# Patient Record
Sex: Male | Born: 1985 | Race: Black or African American | Hispanic: No | Marital: Single | Smoking: Never smoker
Health system: Southern US, Community
[De-identification: ages and names within clinical notes are randomized; demographics above are authoritative.]

## PROBLEM LIST (undated history)

## (undated) DIAGNOSIS — M419 Scoliosis, unspecified: Secondary | ICD-10-CM

## (undated) DIAGNOSIS — I10 Essential (primary) hypertension: Secondary | ICD-10-CM

---

## 2018-01-19 ENCOUNTER — Emergency Department: Payer: Self-pay

## 2018-01-19 ENCOUNTER — Encounter: Payer: Self-pay | Admitting: Emergency Medicine

## 2018-01-19 ENCOUNTER — Emergency Department
Admission: EM | Admit: 2018-01-19 | Discharge: 2018-01-19 | Disposition: A | Discharge status: Home / Self Care | Payer: Self-pay | Attending: Emergency Medicine | Admitting: Emergency Medicine

## 2018-01-19 DIAGNOSIS — X509XXA Other and unspecified overexertion or strenuous movements or postures, initial encounter: Secondary | ICD-10-CM | POA: Insufficient documentation

## 2018-01-19 DIAGNOSIS — Y999 Unspecified external cause status: Secondary | ICD-10-CM | POA: Insufficient documentation

## 2018-01-19 DIAGNOSIS — S39012A Strain of muscle, fascia and tendon of lower back, initial encounter: Secondary | ICD-10-CM | POA: Insufficient documentation

## 2018-01-19 DIAGNOSIS — R079 Chest pain, unspecified: Secondary | ICD-10-CM | POA: Insufficient documentation

## 2018-01-19 DIAGNOSIS — Y929 Unspecified place or not applicable: Secondary | ICD-10-CM | POA: Insufficient documentation

## 2018-01-19 DIAGNOSIS — Y939 Activity, unspecified: Secondary | ICD-10-CM | POA: Insufficient documentation

## 2018-01-19 DIAGNOSIS — I10 Essential (primary) hypertension: Secondary | ICD-10-CM | POA: Insufficient documentation

## 2018-01-19 HISTORY — DX: Essential (primary) hypertension: I10

## 2018-01-19 LAB — URINE DRUG SCREEN, QUALITATIVE (ARMC ONLY)
AMPHETAMINES, UR SCREEN: NOT DETECTED
BARBITURATES, UR SCREEN: NOT DETECTED
Cannabinoid 50 Ng, Ur ~~LOC~~: NOT DETECTED
Cocaine Metabolite,Ur ~~LOC~~: NOT DETECTED
MDMA (Ecstasy)Ur Screen: NOT DETECTED
METHADONE SCREEN, URINE: NOT DETECTED
Opiate, Ur Screen: NOT DETECTED
PHENCYCLIDINE (PCP) UR S: NOT DETECTED
Tricyclic, Ur Screen: NOT DETECTED

## 2018-01-19 LAB — CBC
HCT: 43.7 % (ref 40.0–52.0)
Hemoglobin: 15.2 g/dL (ref 13.0–18.0)
MCH: 28.6 pg (ref 26.0–34.0)
MCHC: 34.7 g/dL (ref 32.0–36.0)
MCV: 82.2 fL (ref 80.0–100.0)
PLATELETS: 232 10*3/uL (ref 150–440)
RBC: 5.31 MIL/uL (ref 4.40–5.90)
RDW: 15 % — AB (ref 11.5–14.5)
WBC: 8 10*3/uL (ref 3.8–10.6)

## 2018-01-19 LAB — BASIC METABOLIC PANEL
Anion gap: 8 (ref 5–15)
BUN: 17 mg/dL (ref 6–20)
CALCIUM: 9.1 mg/dL (ref 8.9–10.3)
CO2: 28 mmol/L (ref 22–32)
CREATININE: 1.52 mg/dL — AB (ref 0.61–1.24)
Chloride: 104 mmol/L (ref 98–111)
GFR calc non Af Amer: 60 mL/min — ABNORMAL LOW (ref >60)
Glucose, Bld: 97 mg/dL (ref 70–99)
Potassium: 3.3 mmol/L — ABNORMAL LOW (ref 3.5–5.1)
SODIUM: 140 mmol/L (ref 135–145)

## 2018-01-19 LAB — TROPONIN I: Troponin I: <0.03 ng/mL (ref <0.03)

## 2018-01-19 LAB — ETHANOL

## 2018-01-19 MED ORDER — HYDROCHLOROTHIAZIDE 25 MG PO TABS
25.0000 mg | ORAL_TABLET | Freq: Every day | ORAL | Status: DC
  Administered 2018-01-19: 25 mg via ORAL

## 2018-01-19 MED ORDER — NAPROXEN 500 MG PO TABS: 500.0000 mg | ORAL_TABLET | Freq: Two times a day (BID) | ORAL | 0 refills | Status: AC

## 2018-01-19 MED ORDER — LISINOPRIL 40 MG PO TABS: 40.0000 mg | ORAL_TABLET | Freq: Every day | ORAL | 0 refills | Status: DC

## 2018-01-19 MED ORDER — POTASSIUM CHLORIDE CRYS ER 20 MEQ PO TBCR
40.0000 meq | EXTENDED_RELEASE_TABLET | Freq: Once | ORAL | Status: AC
Start: 2018-01-19 — End: 2018-01-19
  Administered 2018-01-19: 40 meq via ORAL
  Filled 2018-01-19: qty 2

## 2018-01-19 MED ORDER — HYDROCHLOROTHIAZIDE 25 MG PO TABS
ORAL_TABLET | ORAL | Status: AC
  Administered 2018-01-19: 25 mg via ORAL
  Filled 2018-01-19: qty 1

## 2018-01-19 MED ORDER — LISINOPRIL 10 MG PO TABS
40.0000 mg | ORAL_TABLET | Freq: Once | ORAL | Status: AC
  Administered 2018-01-19: 40 mg via ORAL
  Filled 2018-01-19: qty 4

## 2018-01-19 MED ORDER — CYCLOBENZAPRINE HCL 5 MG PO TABS: ORAL_TABLET | ORAL | 0 refills | Status: AC

## 2018-01-19 MED ORDER — CLONIDINE HCL 0.1 MG PO TABS: 0.1000 mg | ORAL_TABLET | Freq: Once | ORAL | Status: DC

## 2018-01-19 MED ORDER — AMLODIPINE BESYLATE 10 MG PO TABS: 10.0000 mg | ORAL_TABLET | Freq: Every day | ORAL | 0 refills | Status: DC

## 2018-01-19 MED ORDER — METOPROLOL TARTRATE 25 MG PO TABS: 25.0000 mg | ORAL_TABLET | Freq: Two times a day (BID) | ORAL | 0 refills | Status: DC

## 2018-01-19 MED ORDER — KETOROLAC TROMETHAMINE 30 MG/ML IJ SOLN
10.0000 mg | Freq: Once | INTRAMUSCULAR | Status: AC
  Administered 2018-01-19: 9.9 mg via INTRAVENOUS
  Filled 2018-01-19: qty 1

## 2018-01-19 MED ORDER — AMLODIPINE BESYLATE 5 MG PO TABS
10.0000 mg | ORAL_TABLET | Freq: Once | ORAL | Status: AC
Start: 2018-01-19 — End: 2018-01-19
  Administered 2018-01-19: 10 mg via ORAL
  Filled 2018-01-19: qty 2

## 2018-01-19 MED ORDER — METOPROLOL TARTRATE 25 MG PO TABS
25.0000 mg | ORAL_TABLET | Freq: Once | ORAL | Status: AC
  Administered 2018-01-19: 25 mg via ORAL
  Filled 2018-01-19: qty 1

## 2018-01-19 MED ORDER — HYDROCHLOROTHIAZIDE 25 MG PO TABS: 25.0000 mg | ORAL_TABLET | Freq: Every day | ORAL | 0 refills | Status: DC

## 2018-01-19 NOTE — ED Notes (Signed)
Dr. Sung at the bedside for pt evaluation 

## 2018-01-19 NOTE — ED Notes (Signed)
NAD noted at time of D/C. Pt denies questions or concerns. Pt ambulatory to the lobby at this time. MD aware that patient continues to be hypertensive at discharge.

## 2018-01-19 NOTE — Discharge Instructions (Addendum)
1.  You may take medicines as needed for pain and muscle spasms (Naprosyn/Flexeril). 2.  Apply moist heat to affected area several times daily. 3.  Please restart your blood pressure medicines: Lisinopril 40 mg daily Norvasc 10 mg daily HCTZ 25 mg daily Metoprolol tartrate 25 mg twice daily 4.  Return to the ER for worsening symptoms, persistent vomiting, difficulty breathing or other concerns.

## 2018-01-19 NOTE — ED Triage Notes (Signed)
Pt c/o intermittent central chest pain since waking Monday AM. Pain consist throughout night where pt was unable to sleep.

## 2018-01-19 NOTE — ED Provider Notes (Signed)
[facility] Emergency Department Provider Note   ____________________________________________   First MD Initiated Contact with Patient 01/19/18 [phone]     (approximate)  I have reviewed the triage vital signs and the nursing notes.   HISTORY  Chief Complaint Chest Pain    HPI Stephen Montgomery is a 32 y.o. male who presents to the ED from home with a chief complaint of chest pain and left back strain.  Patient reports bending over to pick something up yesterday and noted pain to his left lower back since.  Pain exacerbated by movement.  Also notes central chest pain/soreness since waking up from a nap yesterday morning which prevented him from going to sleep tonight.  Denies associated diaphoresis, shortness of breath, nausea/vomiting, palpitations or dizziness.  Admits that he has not taken any of his for blood pressure medicines for the past 2-1/2 months.  Denies recent fever, chills, abdominal pain, nausea, vomiting or diarrhea.  Denies recent travel or trauma.   Past Medical History:  Diagnosis Date  . Hypertension     There are no active problems to display for this patient.   History reviewed. No pertinent surgical history.  Prior to Admission medications   Not on File    Allergies Patient has no known allergies.  History reviewed. No pertinent family history.  Social History Social History   Tobacco Use  . Smoking status: Never Smoker  . Smokeless tobacco: Never Used  Substance Use Topics  . Alcohol use: Yes  . Drug use: Not Currently    Review of Systems  Constitutional: No fever/chills Eyes: No visual changes. ENT: No sore throat. Cardiovascular: Positive for chest pain. Respiratory: Denies shortness of breath. Gastrointestinal: No abdominal pain.  No nausea, no vomiting.  No diarrhea.  No constipation. Genitourinary: Negative for dysuria. Musculoskeletal: Positive for back pain. Skin: Negative for rash. Neurological:  Negative for headaches, focal weakness or numbness.   ____________________________________________   PHYSICAL EXAM:  VITAL SIGNS: ED Triage Vitals [01/19/18 0201]  Enc Vitals Group     BP (!) 195/118     Pulse Rate 89     Resp 17     Temp 98.7 F (37.1 C)     Temp Source Oral     SpO2 99 %     Weight      Height      Head Circumference      Peak Flow      Pain Score      Pain Loc      Pain Edu?      Excl. in GC?     Constitutional: Asleep, awakened for exam.  Alert and oriented. Well appearing and in no acute distress. Eyes: Conjunctivae are normal. PERRL. EOMI. Head: Atraumatic. Nose: No congestion/rhinnorhea. Mouth/Throat: Mucous membranes are moist.  Oropharynx non-erythematous. Neck: No stridor.   Cardiovascular: Normal rate, regular rhythm. Grossly normal heart sounds.  Good peripheral circulation. Respiratory: Normal respiratory effort.  No retractions. Lungs CTAB. Gastrointestinal: Soft and nontender to light or deep palpation. No distention. No abdominal bruits. No CVA tenderness. Musculoskeletal: No spinal tenderness to palpation.  Mild left lumbar paraspinal muscle spasms.  No lower extremity tenderness nor edema.  No joint effusions. Neurologic:  Normal speech and language. No gross focal neurologic deficits are appreciated. No gait instability. Skin:  Skin is warm, dry and intact. No rash noted. Psychiatric: Mood and affect are normal. Speech and behavior are normal.  ____________________________________________   LABS (all labs ordered are listed,  but only abnormal results are displayed)  Labs Reviewed  BASIC METABOLIC PANEL - Abnormal; Notable for the following components:      Result Value   Potassium 3.3 (*)    Creatinine, Ser 1.52 (*)    GFR calc non Af Amer 60 (*)    All other components within normal limits  CBC - Abnormal; Notable for the following components:   RDW 15.0 (*)    All other components within normal limits  URINE DRUG SCREEN,  QUALITATIVE ([facility] ONLY) - Abnormal; Notable for the following components:   Benzodiazepine, Ur Scrn TEST NOT PERFORMED, REAGENT NOT AVAILABLE (*)    All other components within normal limits  TROPONIN I  TROPONIN I  ETHANOL   ____________________________________________  EKG  ED ECG REPORT I, , J, the attending physician, personally viewed and interpreted this ECG.   Date: 01/19/2018  EKG Time: 0158  Rate: 85  Rhythm: normal EKG, normal sinus rhythm  Axis: Normal  Intervals:none  ST&T Change: Nonspecific  ____________________________________________  RADIOLOGY  ED MD interpretation: No acute cardiopulmonary process  Official radiology report(s): Dg Chest 2 View  Result Date: 01/19/2018 CLINICAL DATA:  Acute onset of central chest pain and left back pain. EXAM: CHEST - 2 VIEW COMPARISON:  None. FINDINGS: The lungs are well-aerated and clear. There is no evidence of focal opacification, pleural effusion or pneumothorax. The heart is normal in size; the mediastinal contour is within normal limits. No acute osseous abnormalities are seen. IMPRESSION: No acute cardiopulmonary process seen. Electronically Signed   By: Roanna Raider M.D.   On: 01/19/2018 02:20    ____________________________________________   PROCEDURES  Procedure(s) performed: None  Procedures  Critical Care performed: No  ____________________________________________   INITIAL IMPRESSION / ASSESSMENT AND PLAN / ED COURSE  As part of my medical decision making, I reviewed the following data within the electronic MEDICAL RECORD NUMBER Nursing notes reviewed and incorporated, Labs reviewed, EKG interpreted, Old chart reviewed, Radiograph reviewed and Notes from prior ED visits   32 year old male with hypertension who presents with chest pain and lumbar strain. Differential diagnosis includes, but is not limited to, ACS, aortic dissection, pulmonary embolism, cardiac tamponade, pneumothorax, pneumonia,  pericarditis, myocarditis, GI-related causes including esophagitis/gastritis, and musculoskeletal chest wall pain.    Laboratory results notable for mild hypokalemia, renal insufficiency, initial troponin is negative.  Will administer 10 mg IV Toradol for back pain, replete potassium.  I personally reviewed patient's chart and am able to see the for antihypertensive he is supposed be taking.  We will order these to be taken this morning.  Will reassess after repeat troponin.  Clinical Course as of Jan 20 640  Tue Jan 19, 2018  1610 Repeat troponin unremarkable.  Patient sleeping in no acute distress.  Updated him of test results.  Will discharge home with prescriptions for all 4 of his anti-hypertensives; NSAIDs and muscle relaxer for lumbar strain and encouraged him to follow-up with his PCP this week.  Strict return precautions given.  Patient verbalizes understanding and agrees with plan of care.   [JS]    Clinical Course User Index [JS] Irean Hong, MD     ____________________________________________   FINAL CLINICAL IMPRESSION(S) / ED DIAGNOSES  Final diagnoses:  Chest pain, unspecified type  Essential hypertension  Lumbosacral strain, initial encounter     ED Discharge Orders    None       Note:  This document was prepared using Dragon voice recognition software and may include  unintentional dictation errors.    Irean HongSung,  J, MD 01/19/18 [phone]

## 2018-02-02 ENCOUNTER — Emergency Department
Admission: EM | Admit: 2018-02-02 | Discharge: 2018-02-02 | Discharge status: Against Medical Advice | Payer: Medicaid Other | Attending: Emergency Medicine | Admitting: Emergency Medicine

## 2018-02-02 ENCOUNTER — Encounter: Payer: Self-pay | Admitting: Emergency Medicine

## 2018-02-02 DIAGNOSIS — R42 Dizziness and giddiness: Secondary | ICD-10-CM | POA: Insufficient documentation

## 2018-02-02 DIAGNOSIS — Z5321 Procedure and treatment not carried out due to patient leaving prior to being seen by health care provider: Secondary | ICD-10-CM | POA: Insufficient documentation

## 2018-02-02 NOTE — ED Notes (Signed)
Malinda MD notified that pt left

## 2018-02-02 NOTE — ED Notes (Signed)
First Nurse Note: Patient complaining of dizziness starting last PM, states he has been dizzy like this before but it is lasting longer this time.  States it feels like the room is spinning.

## 2018-02-02 NOTE — ED Triage Notes (Signed)
Pt reports wasn't able to sleep for the 3 days and then yesterday he started feeling dizzy. Pt with increased stress recently. Pt denies CP, SOB.

## 2018-02-02 NOTE — ED Notes (Signed)
Pt refusing blood work and to give urine at this time. RN educated pt on importance of lab results so that MD can  evaluate pt. Pt continues to refuse. Pt stated he was going to leave and proceeded to walk out of tx area. Pt ambulated with steady gait. ABCs intact. NAD.

## 2018-02-02 NOTE — ED Notes (Signed)
Pt refusing to have lab work done. Pt states that he is not trying to be difficult but everytime they do blood work it doesn't tell anything.

## 2018-02-02 NOTE — ED Notes (Signed)
Pt ambulated to tx area with steady gait. ABCs intact. NAD.

## 2018-02-03 ENCOUNTER — Telehealth: Payer: Self-pay | Admitting: Emergency Medicine

## 2018-02-03 NOTE — Telephone Encounter (Signed)
Called patient due to lwot to inquire about condition and follow up plans. Says he left because he did not want his blood drawn because he always gets a bruise for several days after blood draws.  I told that labwork is important in diagnosing, but that he should really just try to get a doctor exam.   He does not have pcp and is new to area.  He says he will look up local doctors to try to get one.

## 2018-03-17 ENCOUNTER — Emergency Department: Payer: Self-pay

## 2018-03-17 ENCOUNTER — Other Ambulatory Visit: Payer: Self-pay

## 2018-03-17 ENCOUNTER — Emergency Department
Admission: EM | Admit: 2018-03-17 | Discharge: 2018-03-17 | Disposition: A | Discharge status: Home / Self Care | Payer: Self-pay | Attending: Emergency Medicine | Admitting: Emergency Medicine

## 2018-03-17 DIAGNOSIS — G5622 Lesion of ulnar nerve, left upper limb: Secondary | ICD-10-CM | POA: Insufficient documentation

## 2018-03-17 DIAGNOSIS — Z9119 Patient's noncompliance with other medical treatment and regimen: Secondary | ICD-10-CM | POA: Insufficient documentation

## 2018-03-17 DIAGNOSIS — I10 Essential (primary) hypertension: Secondary | ICD-10-CM | POA: Insufficient documentation

## 2018-03-17 DIAGNOSIS — Z91199 Patient's noncompliance with other medical treatment and regimen due to unspecified reason: Secondary | ICD-10-CM

## 2018-03-17 DIAGNOSIS — Z79899 Other long term (current) drug therapy: Secondary | ICD-10-CM | POA: Insufficient documentation

## 2018-03-17 DIAGNOSIS — R0789 Other chest pain: Secondary | ICD-10-CM | POA: Insufficient documentation

## 2018-03-17 DIAGNOSIS — N289 Disorder of kidney and ureter, unspecified: Secondary | ICD-10-CM | POA: Insufficient documentation

## 2018-03-17 LAB — CBC WITH DIFFERENTIAL/PLATELET
Basophils Absolute: 0 10*3/uL (ref 0–0.1)
Basophils Relative: 0 %
Eosinophils Absolute: 0.3 10*3/uL (ref 0–0.7)
Eosinophils Relative: 3 %
HEMATOCRIT: 47 % (ref 40.0–52.0)
HEMOGLOBIN: 15.9 g/dL (ref 13.0–18.0)
LYMPHS ABS: 1.8 10*3/uL (ref 1.0–3.6)
LYMPHS PCT: 20 %
MCH: 27.9 pg (ref 26.0–34.0)
MCHC: 33.9 g/dL (ref 32.0–36.0)
MCV: 82.3 fL (ref 80.0–100.0)
MONOS PCT: 8 %
Monocytes Absolute: 0.8 10*3/uL (ref 0.2–1.0)
NEUTROS ABS: 6.2 10*3/uL (ref 1.4–6.5)
NEUTROS PCT: 69 %
Platelets: 275 10*3/uL (ref 150–440)
RBC: 5.71 MIL/uL (ref 4.40–5.90)
RDW: 14.8 % — ABNORMAL HIGH (ref 11.5–14.5)
WBC: 9 10*3/uL (ref 3.8–10.6)

## 2018-03-17 LAB — BASIC METABOLIC PANEL
ANION GAP: 7 (ref 5–15)
BUN: 15 mg/dL (ref 6–20)
CHLORIDE: 105 mmol/L (ref 98–111)
CO2: 27 mmol/L (ref 22–32)
Calcium: 9.1 mg/dL (ref 8.9–10.3)
Creatinine, Ser: 1.45 mg/dL — ABNORMAL HIGH (ref 0.61–1.24)
GFR calc Af Amer: >60 mL/min (ref >60)
GFR calc non Af Amer: >60 mL/min (ref >60)
GLUCOSE: 100 mg/dL — AB (ref 70–99)
POTASSIUM: 4.1 mmol/L (ref 3.5–5.1)
Sodium: 139 mmol/L (ref 135–145)

## 2018-03-17 LAB — TROPONIN I: Troponin I: <0.03 ng/mL (ref <0.03)

## 2018-03-17 MED ORDER — AMLODIPINE BESYLATE 10 MG PO TABS: 10.0000 mg | ORAL_TABLET | Freq: Every day | ORAL | 0 refills | Status: DC

## 2018-03-17 MED ORDER — LISINOPRIL 40 MG PO TABS: 40.0000 mg | ORAL_TABLET | Freq: Every day | ORAL | 0 refills | Status: AC

## 2018-03-17 MED ORDER — METOPROLOL TARTRATE 25 MG PO TABS: 25.0000 mg | ORAL_TABLET | Freq: Two times a day (BID) | ORAL | 0 refills | Status: AC

## 2018-03-17 MED ORDER — HYDROCHLOROTHIAZIDE 25 MG PO TABS: 25.0000 mg | ORAL_TABLET | Freq: Every day | ORAL | 0 refills | Status: AC

## 2018-03-17 MED ORDER — METOPROLOL TARTRATE 50 MG PO TABS
25.0000 mg | ORAL_TABLET | Freq: Once | ORAL | Status: AC
  Administered 2018-03-17: 25 mg via ORAL
  Filled 2018-03-17: qty 1
  Filled 2018-03-17: qty 0.5

## 2018-03-17 MED ORDER — AMLODIPINE BESYLATE 5 MG PO TABS
10.0000 mg | ORAL_TABLET | Freq: Once | ORAL | Status: AC
  Administered 2018-03-17: 10 mg via ORAL
  Filled 2018-03-17: qty 2

## 2018-03-17 NOTE — Clinical Social Work Note (Signed)
CSW received consult that patient needs assistance with medications because he does not have any insurance.  CSW spoke to patient and told him that the best option would be to go to the medication management clinic.  CSW offered to provide contact information but patient stated he already had it.  CSW to sign off, please reconsult if social work needs arise.  Ervin Knack. Marti Acebo, MSW, Theresia Majors [phone]  03/17/2018 4:54 PM

## 2018-03-17 NOTE — ED Triage Notes (Signed)
Pt has left hand numbness for four days.  Denies injury or trauma.  Radiates to wrist.  Numbness mostly in 4th and 5th digits.  Also had some chest pain this morning.  Denies associated symptoms or radiation with chest pain.  Denies chest pain at this time.

## 2018-03-17 NOTE — ED Provider Notes (Signed)
[facility] Emergency Department Provider Note  ____________________________________________   I have reviewed the triage vital signs and the nursing notes. Where available I have reviewed prior notes and, if possible and indicated, outside hospital notes.    HISTORY  Chief Complaint Hand Pain    HPI Stephen Montgomery is a 32 y.o. male history of hypertension which is poorly controlled baseline blood pressure she states are always 200/100 and something, has been on his blood pressure medications for financial reasons" because they do not do any good" since January.  Does have a history of renal insufficiency.  States that he has chronic chest wall pain which is there every day all the time there is no change in that and that is not why he is here.  On the right chest wall it hurts when he touches or change position he had stress test on extensive work-up for that.  Is not exertional, and he does not want any work-up for it.  He does state however that the only reason he is here is because he has tingling to the lateral aspect of the fourth digit and to the entire fifth digit sometimes.  Is been going on for years.  Is worse when he applies pressure to his elbow by lying wrong way.  He has no weakness.  He has no neck pain.  He has no chest pain associated with this.  He states that it is irritating and he wants to have it evaluated.3 Is adamant that he does not want a large work-up he does not want to stay in the hospital.  Past Medical History:  Diagnosis Date  . Hypertension     There are no active problems to display for this patient.   No past surgical history on file.  Prior to Admission medications   Medication Sig Start Date End Date Taking? Authorizing Provider  amLODipine (NORVASC) 10 MG tablet Take 1 tablet (10 mg total) by mouth daily. 01/19/18   Irean Hong, MD  cyclobenzaprine (FLEXERIL) 5 MG tablet 1 tablet every 8 hours as he did for muscle spasms  01/19/18   Irean Hong, MD  hydrochlorothiazide (HYDRODIURIL) 25 MG tablet Take 1 tablet (25 mg total) by mouth daily. 01/19/18   Irean Hong, MD  lisinopril (PRINIVIL,ZESTRIL) 40 MG tablet Take 1 tablet (40 mg total) by mouth daily. 01/19/18   Irean Hong, MD  metoprolol tartrate (LOPRESSOR) 25 MG tablet Take 1 tablet (25 mg total) by mouth 2 (two) times daily. 01/19/18   Irean Hong, MD  naproxen (NAPROSYN) 500 MG tablet Take 1 tablet (500 mg total) by mouth 2 (two) times daily with a meal. 01/19/18   Irean Hong, MD    Allergies Patient has no known allergies.  No family history on file.  Social History Social History   Tobacco Use  . Smoking status: Never Smoker  . Smokeless tobacco: Never Used  Substance Use Topics  . Alcohol use: Yes    Comment: liquor, 1 pint a week  . Drug use: Not Currently    Review of Systems Constitutional: No fever/chills Eyes: No visual changes. ENT: No sore throat. No stiff neck no neck pain Cardiovascular: See HPI Respiratory: Denies shortness of breath. Gastrointestinal:   no vomiting.  No diarrhea.  No constipation. Genitourinary: Negative for dysuria. Musculoskeletal: Negative lower extremity swelling Skin: Negative for rash. Neurological: Negative for severe headaches, focal weakness or    ____________________________________________   PHYSICAL EXAM:  VITAL SIGNS: ED Triage Vitals  Enc Vitals Group     BP 03/17/18 1245 (!) 181/129     Pulse Rate 03/17/18 1245 78     Resp --      Temp 03/17/18 1245 98.5 F (36.9 C)     Temp Source 03/17/18 1245 Oral     SpO2 03/17/18 1245 99 %     Weight 03/17/18 1246 200 lb (90.7 kg)     Height 03/17/18 1246 6\' 2"  (1.88 m)     Head Circumference --      Peak Flow --      Pain Score 03/17/18 1246 10     Pain Loc --      Pain Edu? --      Excl. in GC? --     Constitutional: Alert and oriented. Well appearing and in no acute distress. Eyes: Conjunctivae are normal Head: Atraumatic HEENT: No  congestion/rhinnorhea. Mucous membranes are moist.  Oropharynx non-erythematous Neck:   Nontender with no meningismus, no masses, no stridor Cardiovascular: Normal rate, regular rhythm. Grossly normal heart sounds.  Good peripheral circulation. Respiratory: Normal respiratory effort.  No retractions. Lungs CTAB. : Tender palpation of the right chest wall to touch this area patient states "ouch that the pain right there" and pulls back.  No crepitus no flail chest. Abdominal: Soft and nontender. No distention. No guarding no rebound Back:  There is no focal tenderness or step off.  there is no midline tenderness there are no lesions noted. there is no CVA tenderness Musculoskeletal: No lower extremity tenderness, no upper extremity tenderness. No joint effusions, no DVT signs strong distal pulses no edema Neurologic:  Normal speech and language. No gross focal neurologic deficits are appreciated.  Subjective tingling to the lateral aspect of the fourth and the entire fifth digit.  Is worse when I apply pressure to the ulnar sulcus in the left elbow.  There is no weakness or other symptoms associated with this.  He has negative signs for carpal tunnel. Skin:  Skin is warm, dry and intact. No rash noted. Psychiatric: Mood and affect are normal. Speech and behavior are normal.  ____________________________________________   LABS (all labs ordered are listed, but only abnormal results are displayed)  Labs Reviewed  CBC WITH DIFFERENTIAL/PLATELET - Abnormal; Notable for the following components:      Result Value   RDW 14.8 (*)    All other components within normal limits  BASIC METABOLIC PANEL - Abnormal; Notable for the following components:   Glucose, Bld 100 (*)    Creatinine, Ser 1.45 (*)    All other components within normal limits  TROPONIN I    Pertinent labs  results that were available during my care of the patient were reviewed by me and considered in my medical decision making (see  chart for details). ____________________________________________  EKG  I personally interpreted any EKGs ordered by me or triage Normal sinus rhythm rate 81 bpm no acute ST elevation or depression repolarization abnormality noted, no acute ischemia.  Borderline LVH ____________________________________________  RADIOLOGY  Pertinent labs & imaging results that were available during my care of the patient were reviewed by me and considered in my medical decision making (see chart for details). If possible, patient and/or family made aware of any abnormal findings.  Dg Chest 2 View  Result Date: 03/17/2018 CLINICAL DATA:  Hand numbness for 4 days, no injury EXAM: CHEST - 2 VIEW COMPARISON:  Chest x-ray of 01/19/2018 FINDINGS: No active infiltrate  or effusion is seen. Mediastinal and hilar contours are unremarkable. The heart is within normal limits in size. No bony abnormality is seen. IMPRESSION: No active cardiopulmonary disease. Electronically Signed   By: Dwyane Dee M.D.   On: 03/17/2018 15:18   ____________________________________________    PROCEDURES  Procedure(s) performed: None  Procedures  Critical Care performed: None  ____________________________________________   INITIAL IMPRESSION / ASSESSMENT AND PLAN / ED COURSE  Pertinent labs & imaging results that were available during my care of the patient were reviewed by me and considered in my medical decision making (see chart for details).  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  We will refer him to a hand specialist for this nothing to do emergently today this is not suggestive of acute coronary syndrome.  Patient's blood pressure is quite elevated here, but came back at prior notes, it is however close to its baseline, he is absolutely noncompliant with multiple different intensive medications.  We are giving him his home blood pressure medications here.  We are going to have  him follow closely with primary care and I will refer him.  He does have Medicaid, but he does not use it, I have encouraged him to do so.  I have also encouraged him to follow closely with hand surgeon.  I do not think this represents a stroke.  Patient has very reproducible chronic recurrent chest pain which is been there every day for years and he has had extensive outpatient work-up he states.  I cannot gain access to that.  I have offered him admission to the hospital for further evaluation given his hypertension and he refuses.  He did finally agree to let me at least check a troponin and basic blood work which is reassuring with no evidence of acute coronary syndrome.  Chest x-ray is reassuring.  However, patient does have renal insufficiency.  He and I talked about how this could lead to dialysis in a much less quality of life.  He understands this.  He will he states to try to see if he can take his medications.  I am consulting social work.  Patient understands that without admission the hospital I cannot tell him that his chest pain is benign although clinically it appears to be.    ____________________________________________   FINAL CLINICAL IMPRESSION(S) / ED DIAGNOSES  Final diagnoses:  None      This chart was dictated using voice recognition software.  Despite best efforts to proofread,  errors can occur which can change meaning.      Jeanmarie Plant, MD 03/17/18 1606

## 2018-03-17 NOTE — ED Notes (Signed)
Social worker called to speak with patient. Pt is to follow up with the [facility].

## 2018-03-17 NOTE — ED Notes (Signed)
PT c/o headache 6/10.

## 2018-03-17 NOTE — Discharge Instructions (Signed)
It is very important that you take your hypertension medications.  I am writing the prescriptions.  Might not be a bad idea to gradually introduce them over the week or so.  I would start with the Norvasc and the metoprolol, and gradually add on the lisinopril and the HCTZ over the next week or 2.  Otherwise you run the risk of dropping pressure too quickly and your used to very high blood pressure.  We very strongly advised you follow-up with primary care.  You have no evidence of damage to your kidney from uncontrolled high blood pressure.  You do need a doctor for this as we talked about.  Left unchecked, this could turn into a very serious kidney problems including dialysis.  You have had chest pain, at this time it does not appear that you are actively having a heart attack but it is very concerning to [place].  We asked that you follow-up as an outpatient with cardiology and we have given you that information.  If you have any change in your chronic chest pain or you change your mind about further work-up which you would prefer not to have at this time, return to the emergency department.  You have a pinched nerve in your hand.  Please follow-up with the orthopedic doctors for this and try to avoid applying pressure to that area.  Take Tylenol as it may reduce the inflammation.  If you feel worse in any way, return to the emergency department.

## 2018-03-17 NOTE — ED Notes (Addendum)
Pt denies CP at this time. Pt c/o of numbness on L/hand. Pt denies any pain r/t his CC. NAD noted.

## 2018-03-26 ENCOUNTER — Encounter: Payer: Self-pay | Admitting: Emergency Medicine

## 2018-03-26 ENCOUNTER — Emergency Department
Admission: EM | Admit: 2018-03-26 | Discharge: 2018-03-26 | Disposition: A | Discharge status: Home / Self Care | Payer: Medicaid Other | Attending: Emergency Medicine | Admitting: Emergency Medicine

## 2018-03-26 ENCOUNTER — Other Ambulatory Visit: Payer: Self-pay

## 2018-03-26 DIAGNOSIS — Z79899 Other long term (current) drug therapy: Secondary | ICD-10-CM | POA: Insufficient documentation

## 2018-03-26 DIAGNOSIS — G589 Mononeuropathy, unspecified: Secondary | ICD-10-CM | POA: Insufficient documentation

## 2018-03-26 DIAGNOSIS — I10 Essential (primary) hypertension: Secondary | ICD-10-CM | POA: Insufficient documentation

## 2018-03-26 HISTORY — DX: Scoliosis, unspecified: M41.9

## 2018-03-26 MED ORDER — TRAMADOL HCL 50 MG PO TABS: 50.0000 mg | ORAL_TABLET | Freq: Four times a day (QID) | ORAL | 0 refills | Status: AC | PRN

## 2018-03-26 MED ORDER — PREDNISONE 20 MG PO TABS: 60.0000 mg | ORAL_TABLET | Freq: Every day | ORAL | 0 refills | Status: AC

## 2018-03-26 NOTE — Discharge Instructions (Addendum)
Take the prednisone as prescribed and finish the full course.  This will decrease inflammation and help with the pain.  You should continue the ibuprofen that you have been taking as a baseline for the pain, and you can take the tramadol prescribed today if needed for more severe pain.  We have provided information for a local hand specialist.  You should make an appointment to follow-up there.  Return to the ER for new, worsening, or persistent severe pain, numbness, weakness, difficulty with grip, or any other new or worsening symptoms that concern you.  You should also return for new or worsening chest pain, shortness of breath, or severe headache.

## 2018-03-26 NOTE — ED Notes (Signed)
Pt given ginger ale to drink. 

## 2018-03-26 NOTE — ED Notes (Signed)
Discussed pt concerns with Dr. Sharma Covert. No orders received.

## 2018-03-26 NOTE — ED Provider Notes (Signed)
[facility] Emergency Department Provider Note ____________________________________________   First MD Initiated Contact with Patient 03/26/18 2208     (approximate)  I have reviewed the triage vital signs and the nursing notes.   HISTORY  Chief Complaint Hypertension    HPI Stephen Montgomery is a 32 y.o. male with PMH as noted below who presents with left hand pain, duration 13 days, occurring mainly in the pinky and fourth digit, and described as paresthesias with tingling and numbness which are painful.  The patient was evaluated for this previously in the ED although he was told nothing could be done about it.  He has not followed up with hand specialist as recommended.  He denies any specific trauma preceding this.  The patient is also significantly hypertensive but this is chronic and he has no acute symptoms.  He reports chronic chest pain which is unchanged today but denies headache, shortness of breath, or other acute symptoms.  He states that he has been compliant with his blood pressure medications recently.   Past Medical History:  Diagnosis Date  . Hypertension   . Scoliosis     There are no active problems to display for this patient.   History reviewed. No pertinent surgical history.  Prior to Admission medications   Medication Sig Start Date End Date Taking? Authorizing Provider  amLODipine (NORVASC) 10 MG tablet Take 1 tablet (10 mg total) by mouth daily. 03/17/18   Jeanmarie Plant, MD  cyclobenzaprine (FLEXERIL) 5 MG tablet 1 tablet every 8 hours as he did for muscle spasms 01/19/18   Irean Hong, MD  hydrochlorothiazide (HYDRODIURIL) 25 MG tablet Take 1 tablet (25 mg total) by mouth daily. 03/17/18   Jeanmarie Plant, MD  lisinopril (PRINIVIL,ZESTRIL) 40 MG tablet Take 1 tablet (40 mg total) by mouth daily. 03/17/18   Jeanmarie Plant, MD  metoprolol tartrate (LOPRESSOR) 25 MG tablet Take 1 tablet (25 mg total) by mouth 2 (two) times daily.  03/17/18   Jeanmarie Plant, MD  naproxen (NAPROSYN) 500 MG tablet Take 1 tablet (500 mg total) by mouth 2 (two) times daily with a meal. 01/19/18   Irean Hong, MD  predniSONE (DELTASONE) 20 MG tablet Take 3 tablets (60 mg total) by mouth daily for 5 days. 03/27/18 04/01/18  Dionne Bucy, MD  traMADol (ULTRAM) 50 MG tablet Take 1 tablet (50 mg total) by mouth every 6 (six) hours as needed for up to 5 days for severe pain. 03/26/18 03/31/18  Dionne Bucy, MD    Allergies Patient has no known allergies.  No family history on file.  Social History Social History   Tobacco Use  . Smoking status: Never Smoker  . Smokeless tobacco: Never Used  Substance Use Topics  . Alcohol use: Yes    Comment: liquor, 1 pint a week  . Drug use: Not Currently    Review of Systems  Constitutional: No fever. Eyes: No redness. ENT: No neck pain. Cardiovascular: Positive for chronic chest pain. Respiratory: Denies shortness of breath. Gastrointestinal: No vomiting.  Genitourinary: Negative for flank pain.  Musculoskeletal: Negative for back pain. Skin: Negative for rash. Neurological: Negative for headache.  Positive for numbness and tingling in the left hand.   ____________________________________________   PHYSICAL EXAM:  VITAL SIGNS: ED Triage Vitals  Enc Vitals Group     BP 03/26/18 2042 (!) 195/122     Pulse Rate 03/26/18 2042 96     Resp 03/26/18 2042 18  Temp 03/26/18 2042 98.6 F (37 C)     Temp Source 03/26/18 2042 Oral     SpO2 03/26/18 2042 99 %     Weight 03/26/18 2044 250 lb (113.4 kg)     Height 03/26/18 2044 6\' 2"  (1.88 m)     Head Circumference --      Peak Flow --      Pain Score 03/26/18 2043 10     Pain Loc --      Pain Edu? --      Excl. in GC? --     Constitutional: Alert and oriented. Well appearing and in no acute distress. Eyes: Conjunctivae are normal.  Head: Atraumatic. Nose: No congestion/rhinnorhea. Mouth/Throat: Mucous membranes are  moist.   Neck: Normal range of motion.  Cardiovascular: Good peripheral circulation. Respiratory: Normal respiratory effort.  Gastrointestinal: No distention.  Musculoskeletal: Extremities warm and well perfused.  2+ radial pulse to left arm.  Full range of motion of all joints.  No swelling or tenderness to the left hand, wrist, forearm. Neurologic:  Normal speech and language.  Motor intact in all distributions of left arm and hand.  Subjective decreased sensation in the left fifth and fourth digits. Skin:  Skin is warm and dry. No rash noted. Psychiatric: Mood and affect are normal. Speech and behavior are normal.  ____________________________________________   LABS (all labs ordered are listed, but only abnormal results are displayed)  Labs Reviewed - No data to display ____________________________________________  EKG   ____________________________________________  RADIOLOGY    ____________________________________________   PROCEDURES  Procedure(s) performed: No  Procedures  Critical Care performed: No ____________________________________________   INITIAL IMPRESSION / ASSESSMENT AND PLAN / ED COURSE  Pertinent labs & imaging results that were available during my care of the patient were reviewed by me and considered in my medical decision making (see chart for details).  32 year old male with PMH of hypertension which has been poorly controlled presents with left hand paresthesias and pain for the last 2 weeks.  The patient was most recently seen in the ED on 03/17/2018 and I reviewed these records.  He reported the same symptom although at that time it was documented as having been chronic for months.  However, the patient insists today that it has only been in the last 2 weeks and he has never had this before.  At that time the patient was also noted to be significantly hypertensive and had chronic chest pain.  He had lab work-up which was negative and was  discharged home.  He was to be referred to a hand specialist but he states he did not know how to go about this.  On exam today, the patient is comfortable appearing.  He is hypertensive but his other vital signs are normal.  The remainder of the exam is as described above.  He does have subjective decreased sensation and pain in the left hand in approximately the ulnar distribution, not extending proximally to the wrist.  He has no motor deficits.  The remainder of the upper extremity has no symptoms and he has no other neurologic findings.  Overall this is consistent with a mononeuropathy or neuropraxia somewhat distally in the left arm.  There is no history of trauma.  There is no clinical evidence of stroke.  At this time, there is no specific emergent management indicated.  The patient is already been taking NSAIDs without relief.  I will prescribe prednisone to decrease inflammation as well as tramadol for additional  pain relief and refer the patient to a hand surgeon.  The patient appears to have somewhat limited medical literacy and I explained that a hand specialist would be the appropriate avenue for further work-up and nonemergent management.  In terms of the blood pressure, the patient is currently asymptomatic.  This appears to be a chronic issue and his blood pressure was in a similar range at his last visit.  He states that he previously was poorly compliant with some of his medications but has been taking them recently.  He has no acute symptoms to suggest hypertensive crisis and there is no indication for work-up at this time.  The patient agrees with this.  I counseled the patient on the importance of following up with his primary care doctor and compliance with his blood pressure medications.  I again reiterated the importance of following up with a hand specialist and gave thorough return precautions and specific reasons to return to the emergency department.  He expressed understanding,  and agreement with the plan.   ____________________________________________   FINAL CLINICAL IMPRESSION(S) / ED DIAGNOSES  Final diagnoses:  Mononeuropathy  Hypertension, unspecified type      NEW MEDICATIONS STARTED DURING THIS VISIT:  Discharge Medication List as of 03/26/2018 10:56 PM    START taking these medications   Details  predniSONE (DELTASONE) 20 MG tablet Take 3 tablets (60 mg total) by mouth daily for 5 days., Starting Sat 03/27/2018, Until Thu 04/01/2018, Normal    traMADol (ULTRAM) 50 MG tablet Take 1 tablet (50 mg total) by mouth every 6 (six) hours as needed for up to 5 days for severe pain., Starting Fri 03/26/2018, Until Wed 03/31/2018, Normal         Note:  This document was prepared using Dragon voice recognition software and may include unintentional dictation errors.    Dionne Bucy, MD 03/26/18 2317

## 2018-03-26 NOTE — ED Triage Notes (Addendum)
Pt presents to ED with worsening left hand pain. Pt states pain radiates from his 4th digit across his hand and stops at his wrist. Pt states pain has been ongoing since the end of September and was seen in this ED for the same on 10/2. Pt was told that he likely had nerve damage or pinched nerve and that "there was nothing he could do". Pt states pain has not improved at all. Denies injury.

## 2018-06-26 ENCOUNTER — Emergency Department: Payer: Medicaid Other

## 2018-06-26 ENCOUNTER — Other Ambulatory Visit: Payer: Self-pay

## 2018-06-26 ENCOUNTER — Encounter: Payer: Self-pay | Admitting: Emergency Medicine

## 2018-06-26 ENCOUNTER — Emergency Department
Admission: EM | Admit: 2018-06-26 | Discharge: 2018-06-26 | Disposition: A | Discharge status: Home / Self Care | Payer: Medicaid Other | Attending: Emergency Medicine | Admitting: Emergency Medicine

## 2018-06-26 DIAGNOSIS — Z5321 Procedure and treatment not carried out due to patient leaving prior to being seen by health care provider: Secondary | ICD-10-CM | POA: Insufficient documentation

## 2018-06-26 DIAGNOSIS — R51 Headache: Secondary | ICD-10-CM | POA: Insufficient documentation

## 2018-06-26 LAB — CBC
HEMATOCRIT: 48.4 % (ref 39.0–52.0)
HEMOGLOBIN: 15.9 g/dL (ref 13.0–17.0)
MCH: 26.4 pg (ref 26.0–34.0)
MCHC: 32.9 g/dL (ref 30.0–36.0)
MCV: 80.4 fL (ref 80.0–100.0)
Platelets: 270 10*3/uL (ref 150–400)
RBC: 6.02 MIL/uL — ABNORMAL HIGH (ref 4.22–5.81)
RDW: 14.6 % (ref 11.5–15.5)
WBC: 9.3 10*3/uL (ref 4.0–10.5)
nRBC: 0 % (ref 0.0–0.2)

## 2018-06-26 LAB — COMPREHENSIVE METABOLIC PANEL
ALBUMIN: 4 g/dL (ref 3.5–5.0)
ALK PHOS: 58 U/L (ref 38–126)
ALT: 27 U/L (ref 0–44)
ANION GAP: 7 (ref 5–15)
AST: 21 U/L (ref 15–41)
BILIRUBIN TOTAL: 0.6 mg/dL (ref 0.3–1.2)
BUN: 10 mg/dL (ref 6–20)
CALCIUM: 8.9 mg/dL (ref 8.9–10.3)
CO2: 26 mmol/L (ref 22–32)
Chloride: 107 mmol/L (ref 98–111)
Creatinine, Ser: 1.48 mg/dL — ABNORMAL HIGH (ref 0.61–1.24)
GFR calc Af Amer: >60 mL/min (ref >60)
GFR calc non Af Amer: >60 mL/min (ref >60)
Glucose, Bld: 109 mg/dL — ABNORMAL HIGH (ref 70–99)
Potassium: 3.3 mmol/L — ABNORMAL LOW (ref 3.5–5.1)
Sodium: 140 mmol/L (ref 135–145)
TOTAL PROTEIN: 7.2 g/dL (ref 6.5–8.1)

## 2018-06-26 MED ORDER — CLONIDINE HCL 0.1 MG PO TABS
0.1000 mg | ORAL_TABLET | Freq: Once | ORAL | Status: AC
  Administered 2018-06-26: 0.1 mg via ORAL
  Filled 2018-06-26: qty 1

## 2018-06-26 MED ORDER — OXYCODONE-ACETAMINOPHEN 5-325 MG PO TABS
2.0000 | ORAL_TABLET | Freq: Once | ORAL | Status: AC
  Administered 2018-06-26: 2 via ORAL
  Filled 2018-06-26: qty 2

## 2018-06-26 NOTE — ED Triage Notes (Signed)
Headache forehead x 4 days. Denies head injury, denies use of blood thinners, denies sinus drainage.

## 2018-06-29 ENCOUNTER — Other Ambulatory Visit: Payer: Self-pay

## 2018-06-29 ENCOUNTER — Emergency Department
Admission: EM | Admit: 2018-06-29 | Discharge: 2018-06-29 | Disposition: A | Discharge status: Home / Self Care | Payer: Self-pay | Attending: Emergency Medicine | Admitting: Emergency Medicine

## 2018-06-29 ENCOUNTER — Encounter: Payer: Self-pay | Admitting: Medical Oncology

## 2018-06-29 ENCOUNTER — Emergency Department: Payer: Self-pay

## 2018-06-29 DIAGNOSIS — R519 Headache, unspecified: Secondary | ICD-10-CM

## 2018-06-29 DIAGNOSIS — I10 Essential (primary) hypertension: Secondary | ICD-10-CM | POA: Insufficient documentation

## 2018-06-29 DIAGNOSIS — I160 Hypertensive urgency: Secondary | ICD-10-CM | POA: Insufficient documentation

## 2018-06-29 DIAGNOSIS — M542 Cervicalgia: Secondary | ICD-10-CM | POA: Insufficient documentation

## 2018-06-29 DIAGNOSIS — R51 Headache: Secondary | ICD-10-CM | POA: Insufficient documentation

## 2018-06-29 LAB — COMPREHENSIVE METABOLIC PANEL
ALT: 24 U/L (ref 0–44)
AST: 19 U/L (ref 15–41)
Albumin: 3.9 g/dL (ref 3.5–5.0)
Alkaline Phosphatase: 50 U/L (ref 38–126)
Anion gap: 9 (ref 5–15)
BUN: 13 mg/dL (ref 6–20)
CO2: 23 mmol/L (ref 22–32)
Calcium: 8.7 mg/dL — ABNORMAL LOW (ref 8.9–10.3)
Chloride: 104 mmol/L (ref 98–111)
Creatinine, Ser: 1.26 mg/dL — ABNORMAL HIGH (ref 0.61–1.24)
GFR calc Af Amer: >60 mL/min (ref >60)
GFR calc non Af Amer: >60 mL/min (ref >60)
Glucose, Bld: 109 mg/dL — ABNORMAL HIGH (ref 70–99)
POTASSIUM: 3.4 mmol/L — AB (ref 3.5–5.1)
Sodium: 136 mmol/L (ref 135–145)
Total Bilirubin: 0.7 mg/dL (ref 0.3–1.2)
Total Protein: 6.9 g/dL (ref 6.5–8.1)

## 2018-06-29 LAB — CBC WITH DIFFERENTIAL/PLATELET
Abs Immature Granulocytes: 0.02 10*3/uL (ref 0.00–0.07)
Basophils Absolute: 0 10*3/uL (ref 0.0–0.1)
Basophils Relative: 0 %
EOS ABS: 0.2 10*3/uL (ref 0.0–0.5)
Eosinophils Relative: 3 %
HCT: 46.2 % (ref 39.0–52.0)
Hemoglobin: 15.2 g/dL (ref 13.0–17.0)
Immature Granulocytes: 0 %
Lymphocytes Relative: 26 %
Lymphs Abs: 1.8 10*3/uL (ref 0.7–4.0)
MCH: 26.4 pg (ref 26.0–34.0)
MCHC: 32.9 g/dL (ref 30.0–36.0)
MCV: 80.3 fL (ref 80.0–100.0)
Monocytes Absolute: 0.5 10*3/uL (ref 0.1–1.0)
Monocytes Relative: 8 %
Neutro Abs: 4.5 10*3/uL (ref 1.7–7.7)
Neutrophils Relative %: 63 %
Platelets: 231 10*3/uL (ref 150–400)
RBC: 5.75 MIL/uL (ref 4.22–5.81)
RDW: 14.5 % (ref 11.5–15.5)
WBC: 7.1 10*3/uL (ref 4.0–10.5)
nRBC: 0 % (ref 0.0–0.2)

## 2018-06-29 LAB — TROPONIN I: Troponin I: <0.03 ng/mL (ref <0.03)

## 2018-06-29 LAB — GLUCOSE, CAPILLARY: Glucose-Capillary: 94 mg/dL (ref 70–99)

## 2018-06-29 MED ORDER — OXYCODONE-ACETAMINOPHEN 5-325 MG PO TABS: 1.0000 | ORAL_TABLET | Freq: Three times a day (TID) | ORAL | 0 refills | Status: AC | PRN

## 2018-06-29 MED ORDER — GADOBUTROL 1 MMOL/ML IV SOLN
10.0000 mL | Freq: Once | INTRAVENOUS | Status: AC | PRN
  Administered 2018-06-29: 10 mL via INTRAVENOUS

## 2018-06-29 MED ORDER — HYDROMORPHONE HCL 1 MG/ML IJ SOLN
0.5000 mg | Freq: Once | INTRAMUSCULAR | Status: AC
  Administered 2018-06-29: 0.5 mg via INTRAVENOUS
  Filled 2018-06-29: qty 1

## 2018-06-29 MED ORDER — HYDROCHLOROTHIAZIDE 25 MG PO TABS
25.0000 mg | ORAL_TABLET | Freq: Every day | ORAL | Status: DC
  Administered 2018-06-29: 25 mg via ORAL
  Filled 2018-06-29: qty 1

## 2018-06-29 MED ORDER — OXYCODONE-ACETAMINOPHEN 5-325 MG PO TABS
2.0000 | ORAL_TABLET | Freq: Once | ORAL | Status: AC
  Administered 2018-06-29: 2 via ORAL
  Filled 2018-06-29: qty 2

## 2018-06-29 MED ORDER — AMLODIPINE BESYLATE 5 MG PO TABS
10.0000 mg | ORAL_TABLET | Freq: Once | ORAL | Status: AC
  Administered 2018-06-29: 10 mg via ORAL
  Filled 2018-06-29: qty 2

## 2018-06-29 MED ORDER — CLONIDINE HCL 0.1 MG PO TABS: 0.1000 mg | ORAL_TABLET | Freq: Two times a day (BID) | ORAL | 11 refills | Status: DC

## 2018-06-29 MED ORDER — CLONIDINE HCL 0.1 MG PO TABS: 0.1000 mg | ORAL_TABLET | Freq: Two times a day (BID) | ORAL | 11 refills | Status: AC

## 2018-06-29 MED ORDER — METOCLOPRAMIDE HCL 5 MG/ML IJ SOLN
10.0000 mg | Freq: Once | INTRAMUSCULAR | Status: AC
  Administered 2018-06-29: 10 mg via INTRAVENOUS
  Filled 2018-06-29: qty 2

## 2018-06-29 MED ORDER — LISINOPRIL 10 MG PO TABS
40.0000 mg | ORAL_TABLET | Freq: Once | ORAL | Status: AC
  Administered 2018-06-29: 40 mg via ORAL
  Filled 2018-06-29: qty 4

## 2018-06-29 MED ORDER — OXYCODONE-ACETAMINOPHEN 5-325 MG PO TABS: 1.0000 | ORAL_TABLET | Freq: Three times a day (TID) | ORAL | 0 refills | Status: DC | PRN

## 2018-06-29 MED ORDER — LABETALOL HCL 5 MG/ML IV SOLN
10.0000 mg | Freq: Once | INTRAVENOUS | Status: AC
  Administered 2018-06-29: 10 mg via INTRAVENOUS
  Filled 2018-06-29: qty 4

## 2018-06-29 NOTE — ED Notes (Signed)
Patient states he is taking the bus home, but can get a ride if needed. Dr. Mayford Knife agrees that patient can take bus home after medication.

## 2018-06-29 NOTE — ED Notes (Signed)
Patient is in MRI.  

## 2018-06-29 NOTE — ED Provider Notes (Signed)
[facility] Emergency Department Provider Note       Time seen: ----------------------------------------- 7:31 AM on 06/29/2018 -----------------------------------------   I have reviewed the triage vital signs and the nursing notes.  HISTORY   Chief Complaint Headache and Neck Pain    HPI Stephen Montgomery is a 33 y.o. male with a history of hypertension, scoliosis who presents to the ED for headache and right-sided neck pain.  Patient was seen here Saturday for same but could not stay due to the long waits.  At that point he had unremarkable labs and head CT.  Patient states he is never had a headache this severe.  He complains of sensitivity to light, he has not had any vomiting.  He is been never been diagnosed with migraines.  He has not taken his blood pressure medicines this morning yet.  Past Medical History:  Diagnosis Date  . Hypertension   . Scoliosis     There are no active problems to display for this patient.   No past surgical history on file.  Allergies Patient has no known allergies.  Social History Social History   Tobacco Use  . Smoking status: Never Smoker  . Smokeless tobacco: Never Used  Substance Use Topics  . Alcohol use: Yes    Comment: liquor, 1 pint a week  . Drug use: Not Currently   Review of Systems Constitutional: Negative for fever. Cardiovascular: Negative for chest pain. Respiratory: Negative for shortness of breath. Gastrointestinal: Negative for abdominal pain, vomiting and diarrhea. Musculoskeletal: Positive for neck pain Skin: Negative for rash. Neurological: Positive for headache  All systems negative/normal/unremarkable except as stated in the HPI  ____________________________________________   PHYSICAL EXAM:  VITAL SIGNS: ED Triage Vitals  Enc Vitals Group     BP 06/29/18 0727 (!) 191/128     Pulse Rate 06/29/18 0727 90     Resp 06/29/18 0727 18     Temp 06/29/18 0727 98 F (36.7 C)    Temp Source 06/29/18 0727 Oral     SpO2 06/29/18 0727 99 %     Weight 06/29/18 0728 238 lb 1.6 oz (108 kg)     Height 06/29/18 0728 6\' 2"  (1.88 m)     Head Circumference --      Peak Flow --      Pain Score 06/29/18 0728 10     Pain Loc --      Pain Edu? --      Excl. in GC? --    Constitutional: Alert and oriented.  Mild distress from pain Eyes: Conjunctivae are normal.  Photophobia is noted ENT      Head: Normocephalic and atraumatic.      Nose: No congestion/rhinnorhea.      Mouth/Throat: Mucous membranes are moist.      Neck: No stridor.  Tenderness on the musculature on the right side of his neck Cardiovascular: Normal rate, regular rhythm. No murmurs, rubs, or gallops. Respiratory: Normal respiratory effort without tachypnea nor retractions. Breath sounds are clear and equal bilaterally. No wheezes/rales/rhonchi. Gastrointestinal: Soft and nontender. Normal bowel sounds Musculoskeletal: Nontender with normal range of motion in extremities. No lower extremity tenderness nor edema. Neurologic:  Normal speech and language. No gross focal neurologic deficits are appreciated.  Skin:  Skin is warm, dry and intact. No rash noted. Psychiatric: Mood and affect are normal. Speech and behavior are normal.  ____________________________________________  EKG: Interpreted by me.  Sinus rhythm the rate of 91 bpm, prolonged PR interval, normal  QRS, normal QT  ____________________________________________  ED COURSE:  As part of my medical decision making, I reviewed the following data within the electronic MEDICAL RECORD NUMBER History obtained from family if available, nursing notes, old chart and ekg, as well as notes from prior ED visits. Patient presented for headache and hypertension, we will assess with labs and imaging as indicated at this time.   Procedures ____________________________________________   LABS (pertinent positives/negatives)  Labs Reviewed  COMPREHENSIVE METABOLIC  PANEL - Abnormal; Notable for the following components:      Result Value   Potassium 3.4 (*)    Glucose, Bld 109 (*)    Creatinine, Ser 1.26 (*)    Calcium 8.7 (*)    All other components within normal limits  CBC WITH DIFFERENTIAL/PLATELET  TROPONIN I  GLUCOSE, CAPILLARY  CBG MONITORING, ED    RADIOLOGY  MRI, MRA brain IMPRESSION: Normal MRI of the brain.  Normal intracranial MR angiography.  Normal MR angiography of the neck vessels. No abnormality seen to explain the clinical presentation. ____________________________________________   DIFFERENTIAL DIAGNOSIS   Hypertensive urgency, hypertensive emergency, dissection, aneurysm, subarachnoid hemorrhage  FINAL ASSESSMENT AND PLAN  Headache, hypertensive urgency   Plan: The patient had presented for severe headache. Patient's labs are reassuring. Patient's imaging also reassuring.  Initially I was concerned he may have an aneurysm or subarachnoid hemorrhage.  I think his problem is his blood pressure.  We have made some improvements in this.  I will stop his amlodipine and place him on clonidine twice a day.  I have advised he cannot miss this medication.  He will be referred to [facility] to establish primary care.   Ulice Dash, MD    Note: This note was generated in part or whole with voice recognition software. Voice recognition is usually quite accurate but there are transcription errors that can and very often do occur. I apologize for any typographical errors that were not detected and corrected.     Emily Filbert, MD 06/29/18 1239

## 2018-06-29 NOTE — ED Triage Notes (Signed)
Pt reports four days of headache and neck pain. Pt was seen here Saturday, LWBS.

## 2018-06-29 NOTE — ED Notes (Signed)
Patient given meal tray and ginger ale, per MD

## 2019-08-06 IMAGING — MR MR MRA NECK WO/W CM
15 of 18 series · 33 of 48 positions shown · IV contrast (gadavist)
Comparison: Head CT 06/26/2018

CLINICAL DATA: Four day history of headache and neck pain. Negative
CT 3 days ago.



[Series 5: T1 · sagittal · 5.0mm · 0.94mm/px · 1 of 25 slices shown (1 of 2)]
[im 1/25]
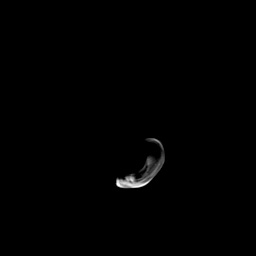

[Series 8: ax dwi_tracew · axial · 3.0mm · 0.83mm/px · z∈[-98,+63]mm · 3 of 55 slices shown]
[im 1/55]
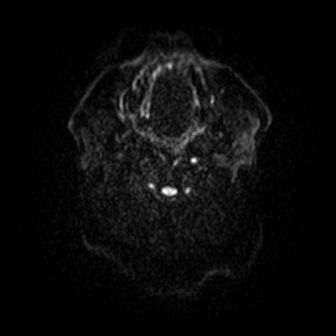
[im 28/55]
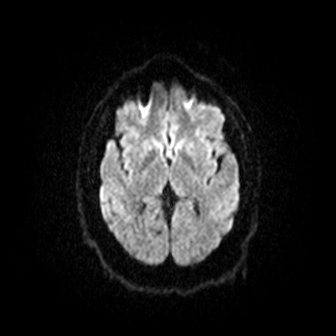
[im 55/55]
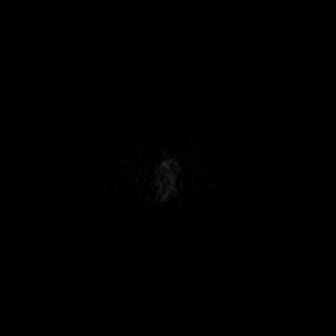

[Series 9: ax dwi_adc · axial · 3.0mm · 0.83mm/px · z∈[-98,+63]mm · 3 of 55 slices shown]
[im 1/55]
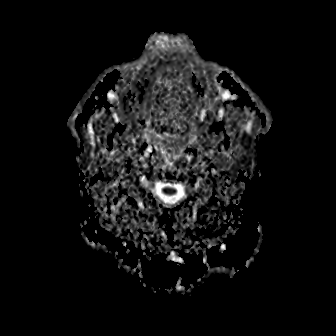
[im 28/55]
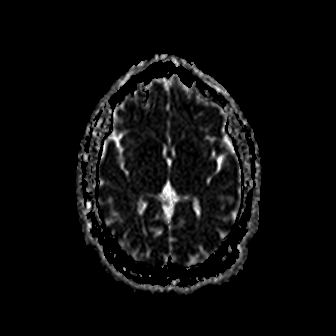
[im 55/55]
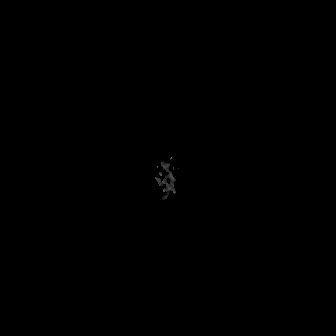

[Series 10: cor dwi_tracew · coronal · 5.0mm · 0.68mm/px · 2 of 39 slices shown]
[im 1/39]
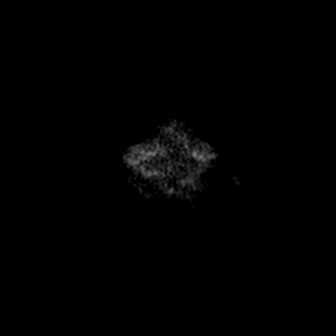
[im 39/39]
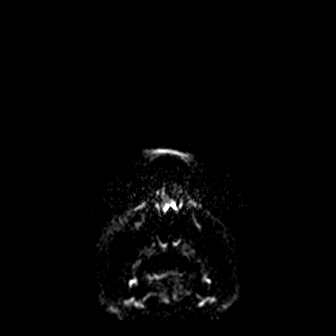

[Series 11: cor dwi_adc · coronal · 5.0mm · 0.68mm/px · 2 of 39 slices shown]
[im 1/39]
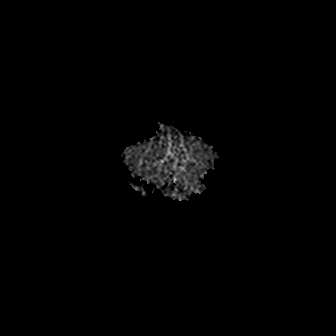
[im 39/39]
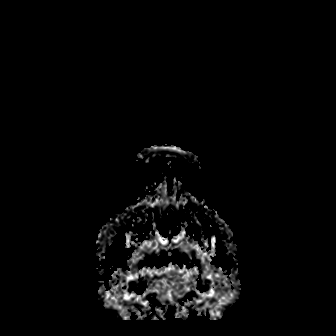

[Series 17: T2 · axial · 5.0mm · 0.45mm/px · 1 of 27 slices shown (1 of 2)]
[im 1/27]
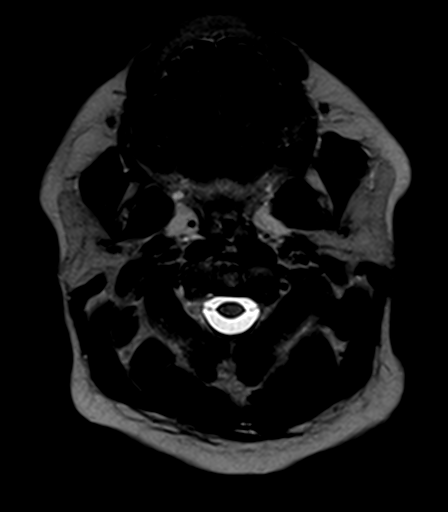

[Series 18: T2-star · axial · 5.0mm · 0.45mm/px · 1 of 27 slices shown]
[im 1/27]
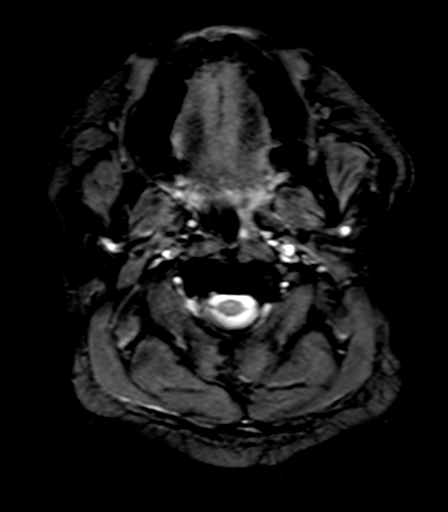

[Series 19: FLAIR · axial · 5.0mm · 1.20mm/px · 1 of 27 slices shown]
[im 1/27]
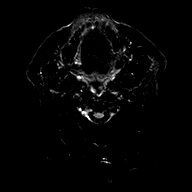

[Series 20: T1 · axial · 5.0mm · 0.90mm/px · 1 of 27 slices shown (2 of 2)]
[im 1/27]
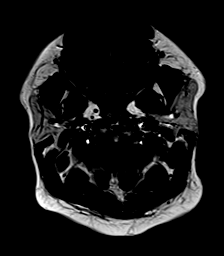

[Series 21: T2 · coronal · 5.0mm · 0.45mm/px · 1 of 31 slices shown (2 of 2)]
[im 1/31]
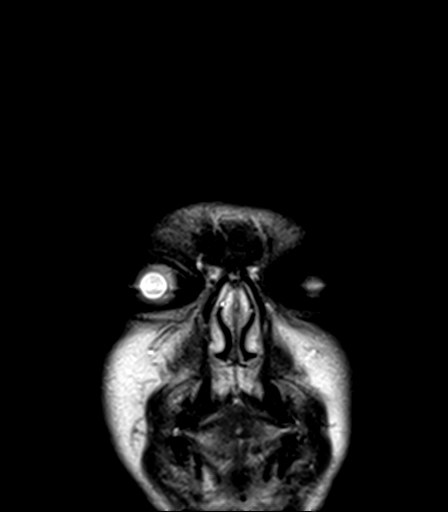

[Series 30: angio_fl3d_cor_pre_ttc=2.0s · coronal · 0.9mm · 0.85mm/px · 4 of 96 slices shown]
[im 1/96]
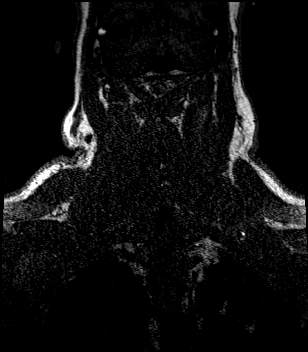
[im 32/96]
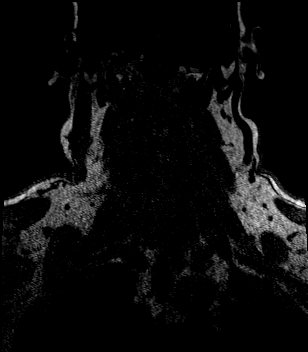
[im 64/96]
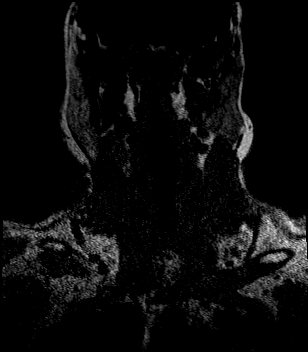
[im 96/96]
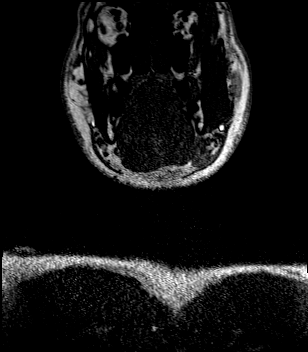

[Series 32: angio_fl3d_cor_post_ttc=2.0s · coronal · 0.9mm · 0.85mm/px · 4 of 96 slices shown]
[im 1/96]
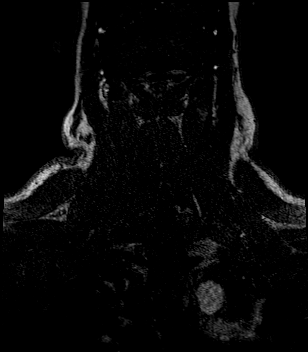
[im 32/96]
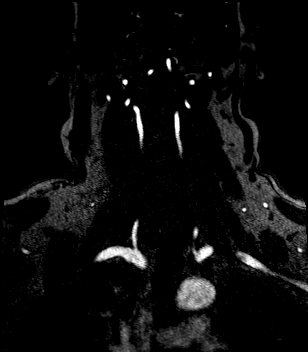
[im 64/96]
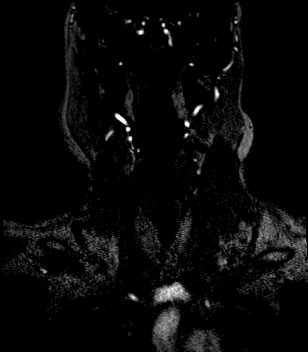
[im 96/96]
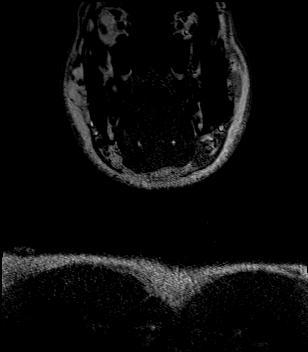

[Series 33: angio_fl3d_cor_post_ttc=2.0s_moco-adv · coronal · 0.9mm · 0.85mm/px · 4 of 96 slices shown]
[im 1/96]
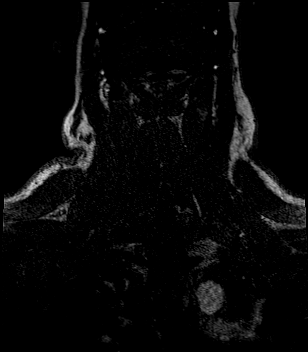
[im 32/96]
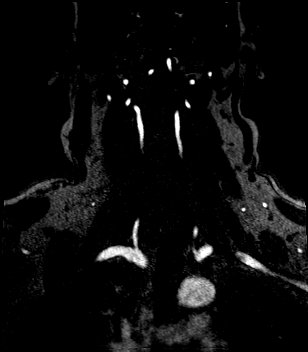
[im 64/96]
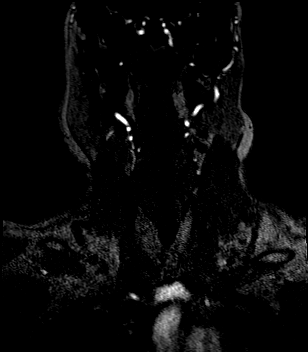
[im 96/96]
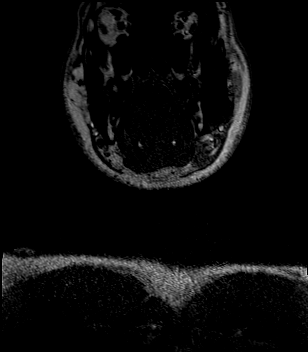

[Series 34: angio_fl3d_cor_post_ttc=2.0s_moco-adv_sub · coronal · 0.9mm · 0.85mm/px · 4 of 96 slices shown]
[im 1/96]
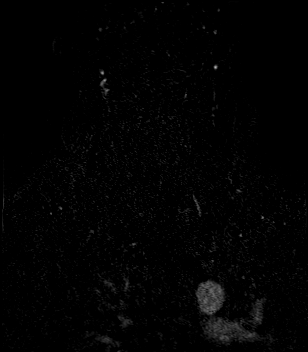
[im 32/96]
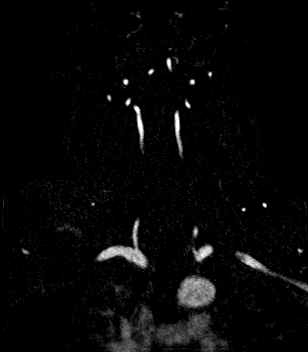
[im 64/96]
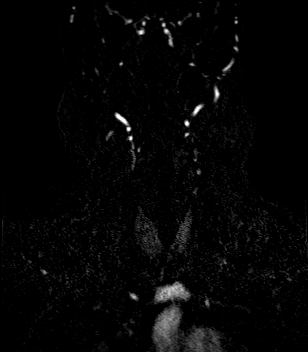
[im 96/96]
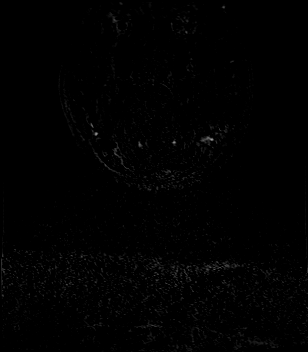

[Series 1033: ant s to · axial · 0.3mm · 0.27mm/px · 1 of 3 slices shown]
[im 1/3]
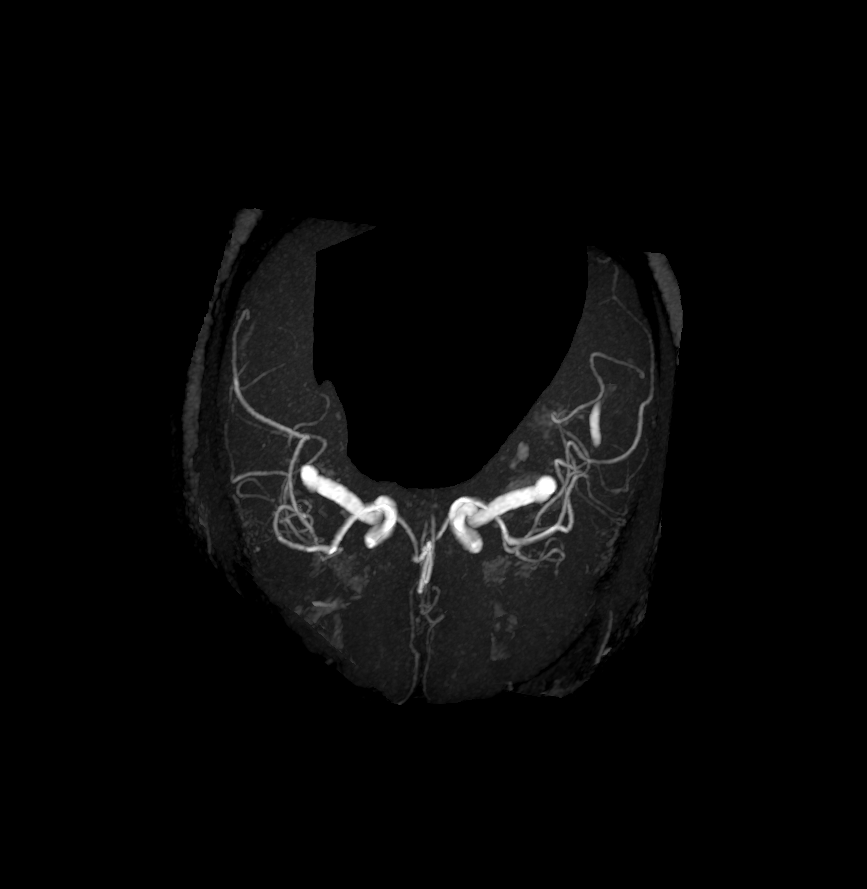

[33 of 48 positions shown; findings below may reference images not displayed]

FINDINGS: MRI HEAD FINDINGS

Brain: The brain has a normal appearance without evidence of
malformation, atrophy, old or acute small or large vessel
infarction, mass lesion, hemorrhage, hydrocephalus or extra-axial
collection.

Vascular: Major vessels at the base of the brain show flow. Venous
sinuses appear patent.

Skull and upper cervical spine: Normal.

Sinuses/Orbits: Clear/normal.

Other: None significant.

MRA HEAD FINDINGS

Both internal carotid arteries are widely patent into the brain. No
siphon stenosis. The anterior and middle cerebral vessels are patent
without proximal stenosis, aneurysm or vascular malformation.

Both vertebral arteries are widely patent to the basilar. No basilar
stenosis. Posterior circulation branch vessels appear normal.

MRA NECK FINDINGS

Branching pattern of the brachiocephalic vessels from the arch is
normal. No origin stenosis. Both common carotid arteries are widely
patent to the bifurcation. Both carotid bifurcations appear normal.
Both cervical internal carotid arteries appear normal.

Both vertebral artery origins are patent. Both vertebral arteries
appear normal to the basilar.
IMPRESSION: Normal MRI of the brain.

Normal intracranial MR angiography.

Normal MR angiography of the neck vessels. No abnormality seen to
explain the clinical presentation.
